# Patient Record
Sex: Female | Born: 1990 | Race: Black or African American | Hispanic: No | Marital: Single | Smoking: Current every day smoker
Health system: Southern US, Community
[De-identification: ages and names within clinical notes are randomized; demographics above are authoritative.]

## PROBLEM LIST (undated history)

## (undated) DIAGNOSIS — N912 Amenorrhea, unspecified: Secondary | ICD-10-CM

## (undated) DIAGNOSIS — I10 Essential (primary) hypertension: Secondary | ICD-10-CM

## (undated) DIAGNOSIS — E669 Obesity, unspecified: Secondary | ICD-10-CM

## (undated) DIAGNOSIS — D649 Anemia, unspecified: Secondary | ICD-10-CM

## (undated) HISTORY — DX: Essential (primary) hypertension: I10

## (undated) HISTORY — DX: Obesity, unspecified: E66.9

---
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
## 2011-04-21 ENCOUNTER — Emergency Department: Payer: Self-pay | Admitting: Emergency Medicine

## 2011-08-18 ENCOUNTER — Emergency Department: Payer: Self-pay | Admitting: Emergency Medicine

## 2013-01-27 ENCOUNTER — Emergency Department: Payer: Self-pay | Admitting: Emergency Medicine

## 2014-07-04 ENCOUNTER — Emergency Department: Payer: Self-pay | Admitting: Emergency Medicine

## 2015-10-28 ENCOUNTER — Encounter: Payer: Self-pay | Admitting: Emergency Medicine

## 2015-10-28 ENCOUNTER — Emergency Department
Admission: EM | Admit: 2015-10-28 | Discharge: 2015-10-29 | Disposition: A | Discharge status: Home / Self Care | Payer: Self-pay | Attending: Emergency Medicine | Admitting: Emergency Medicine

## 2015-10-28 ENCOUNTER — Emergency Department: Payer: Self-pay

## 2015-10-28 DIAGNOSIS — R112 Nausea with vomiting, unspecified: Secondary | ICD-10-CM

## 2015-10-28 DIAGNOSIS — R1032 Left lower quadrant pain: Secondary | ICD-10-CM

## 2015-10-28 DIAGNOSIS — K529 Noninfective gastroenteritis and colitis, unspecified: Secondary | ICD-10-CM | POA: Insufficient documentation

## 2015-10-28 DIAGNOSIS — R197 Diarrhea, unspecified: Secondary | ICD-10-CM

## 2015-10-28 DIAGNOSIS — R1031 Right lower quadrant pain: Secondary | ICD-10-CM

## 2015-10-28 DIAGNOSIS — Z3202 Encounter for pregnancy test, result negative: Secondary | ICD-10-CM | POA: Insufficient documentation

## 2015-10-28 DIAGNOSIS — F17210 Nicotine dependence, cigarettes, uncomplicated: Secondary | ICD-10-CM | POA: Insufficient documentation

## 2015-10-28 DIAGNOSIS — R300 Dysuria: Secondary | ICD-10-CM | POA: Insufficient documentation

## 2015-10-28 DIAGNOSIS — Z79899 Other long term (current) drug therapy: Secondary | ICD-10-CM | POA: Insufficient documentation

## 2015-10-28 HISTORY — DX: Anemia, unspecified: D64.9

## 2015-10-28 HISTORY — DX: Amenorrhea, unspecified: N91.2

## 2015-10-28 LAB — CBC WITH DIFFERENTIAL/PLATELET
BASOS ABS: 0 10*3/uL (ref 0–0.1)
Basophils Relative: 0 %
Eosinophils Absolute: 0.1 10*3/uL (ref 0–0.7)
Eosinophils Relative: 0 %
HEMATOCRIT: 34.1 % — AB (ref 35.0–47.0)
HEMOGLOBIN: 10.4 g/dL — AB (ref 12.0–16.0)
LYMPHS PCT: 12 %
Lymphs Abs: 1.8 10*3/uL (ref 1.0–3.6)
MCH: 21 pg — ABNORMAL LOW (ref 26.0–34.0)
MCHC: 30.5 g/dL — ABNORMAL LOW (ref 32.0–36.0)
MCV: 69 fL — AB (ref 80.0–100.0)
MONO ABS: 0.6 10*3/uL (ref 0.2–0.9)
MONOS PCT: 4 %
Neutro Abs: 12.3 10*3/uL — ABNORMAL HIGH (ref 1.4–6.5)
Neutrophils Relative %: 84 %
Platelets: 296 10*3/uL (ref 150–440)
RBC: 4.95 MIL/uL (ref 3.80–5.20)
RDW: 17.9 % — AB (ref 11.5–14.5)
WBC: 14.9 10*3/uL — ABNORMAL HIGH (ref 3.6–11.0)

## 2015-10-28 LAB — BASIC METABOLIC PANEL
ANION GAP: 8 (ref 5–15)
BUN: 12 mg/dL (ref 6–20)
CALCIUM: 8.9 mg/dL (ref 8.9–10.3)
CHLORIDE: 109 mmol/L (ref 101–111)
CO2: 24 mmol/L (ref 22–32)
Creatinine, Ser: 0.75 mg/dL (ref 0.44–1.00)
GFR calc Af Amer: >60 mL/min (ref >60)
GFR calc non Af Amer: >60 mL/min (ref >60)
GLUCOSE: 96 mg/dL (ref 65–99)
Potassium: 3.7 mmol/L (ref 3.5–5.1)
Sodium: 141 mmol/L (ref 135–145)

## 2015-10-28 LAB — URINALYSIS COMPLETE WITH MICROSCOPIC (ARMC ONLY)
BACTERIA UA: NONE SEEN
Bilirubin Urine: NEGATIVE
Glucose, UA: NEGATIVE mg/dL
LEUKOCYTES UA: NEGATIVE
Nitrite: NEGATIVE
PH: 6 (ref 5.0–8.0)
Protein, ur: 30 mg/dL — AB
SPECIFIC GRAVITY, URINE: 1.015 (ref 1.005–1.030)

## 2015-10-28 MED ORDER — SODIUM CHLORIDE 0.9 % IV BOLUS (SEPSIS)
500.0000 mL | Freq: Once | INTRAVENOUS | Status: AC
  Administered 2015-10-28: 500 mL via INTRAVENOUS

## 2015-10-28 MED ORDER — KETOROLAC TROMETHAMINE 30 MG/ML IJ SOLN
30.0000 mg | Freq: Once | INTRAMUSCULAR | Status: AC
  Administered 2015-10-28: 30 mg via INTRAVENOUS
  Filled 2015-10-28: qty 1

## 2015-10-28 MED ORDER — ONDANSETRON HCL 4 MG/2ML IJ SOLN
4.0000 mg | Freq: Once | INTRAMUSCULAR | Status: AC
  Administered 2015-10-28: 4 mg via INTRAVENOUS
  Filled 2015-10-28: qty 2

## 2015-10-28 NOTE — ED Notes (Addendum)
POC preg negative.

## 2015-10-28 NOTE — ED Provider Notes (Signed)
Time Seen: Approximately ----------------------------------------- 9:52 PM on 10/28/2015 -----------------------------------------   I have reviewed the triage notes  Chief Complaint: Dysuria; Emesis; Diarrhea; and Abdominal Pain   History of Present Illness: Tamara Aguirre is a 24 y.o. female who presents with lower middle quadrant abdominal pain over the last 24 hours. She states on Saturday she started with burning with urination. She has chronic vaginal bleeding is currently on megestrol. She's not sure if there was blood in her urine or her stool. She states she had some nausea and vomiting 2 earlier today with no blood or bile. She states she's had approximately 10 loose watery stools today without any obvious melena on again has history of vaginal bleeding so she cannot tell whether or not there is blood in her stool. Aware of any fever at home but arrives at 40F here. States she's had similar symptoms before which turned out to be a urinary tract infection. She denies any focal nature to her pain and points mainly to the lower midline area. She is not aware of any obvious exacerbating or relieving factors.   Past Medical History  Diagnosis Date  . Anemia   . Amenorrhea     There are no active problems to display for this patient.   History reviewed. No pertinent past surgical history.  History reviewed. No pertinent past surgical history.  Current Outpatient Rx  Name  Route  Sig  Dispense  Refill  . megestrol (MEGACE) 20 MG tablet   Oral   Take 20 mg by mouth 2 (two) times daily.           Allergies:  Sulfa antibiotics  Family History: History reviewed. No pertinent family history.  Social History: Social History  Substance Use Topics  . Smoking status: Current Every Day Smoker -- 0.50 packs/day    Types: Cigarettes  . Smokeless tobacco: None  . Alcohol Use: Yes     Comment: seldom     Review of Systems:   10 point review of systems was performed  and was otherwise negative:  Constitutional: No fever Eyes: No visual disturbances ENT: No sore throat, ear pain Cardiac: No chest pain Respiratory: No shortness of breath, wheezing, or stridor Abdomen: Lower middle quadrant abdominal pain relatively constant without radiation., Patient's still nauseated but the vomiting has slowed. Loose frequent watery stool. Endocrine: No weight loss, No night sweats Extremities: No peripheral edema, cyanosis Skin: No rashes, easy bruising Neurologic: No focal weakness, trouble with speech or swollowing Urologic: Patient does still have burning with urination, no obvious Hematuria, or urinary frequency   Physical Exam:  ED Triage Vitals  Enc Vitals Group     BP 10/28/15 2124 170/112 mmHg     Pulse Rate 10/28/15 2124 90     Resp 10/28/15 2124 18     Temp 10/28/15 2124 99 F (37.2 C)     Temp Source 10/28/15 2124 Oral     SpO2 10/28/15 2124 99 %     Weight 10/28/15 2124 238 lb (107.956 kg)     Height 10/28/15 2124  (1.676 m)     Head Cir --      Peak Flow --      Pain Score 10/28/15 2125 10     Pain Loc --      Pain Edu? --      Excl. in GC? --     General: Awake , Alert , and Oriented times 3; GCS 15 Head: Normal cephalic ,  atraumatic Eyes: Pupils equal , round, reactive to light Nose/Throat: No nasal drainage, patent upper airway without erythema or exudate.  Neck: Supple, Full range of motion, No anterior adenopathy or palpable thyroid masses Lungs: Clear to ascultation without wheezes , rhonchi, or rales Heart: Regular rate, regular rhythm without murmurs , gallops , or rubs Abdomen: Soft, non tender without rebound, guarding , or rigidity; bowel sounds positive and symmetric in all 4 quadrants. No organomegaly .        Extremities: 2 plus symmetric pulses. No edema, clubbing or cyanosis Neurologic: normal ambulation, Motor symmetric without deficits, sensory intact Skin: warm, dry, no rashes   Labs:   All laboratory work  was reviewed including any pertinent negatives or positives listed below:  Labs Reviewed  URINALYSIS COMPLETEWITH MICROSCOPIC ([facility] ONLY)  CBC WITH DIFFERENTIAL/PLATELET  BASIC METABOLIC PANEL   Review of laboratory work showed a slightly elevated white blood cell count   Radiology:    urinary frequency since Friday. Nausea, vomiting, and diarrhea  EXAM: CT ABDOMEN AND PELVIS WITHOUT CONTRAST  TECHNIQUE: Multidetector CT imaging of the abdomen and pelvis was performed following the standard protocol without IV contrast.  COMPARISON: None.  FINDINGS: The lung bases are clear.  The kidneys are symmetrical in size and shape. No hydronephrosis or hydroureter. No renal, ureteral, or bladder stones. No bladder wall thickening.  The unenhanced appearance of the liver, spleen, gallbladder, pancreas, adrenal glands, abdominal aorta, inferior vena cava, and retroperitoneal lymph nodes is unremarkable. Stomach, small bowel, and colon are mostly decompressed. No free air or free fluid in the abdomen. Abdominal wall musculature appears intact.  Pelvis: Appendix is normal. Uterus and ovaries are not enlarged. No free or loculated pelvic fluid collections. No pelvic mass or lymphadenopathy. No destructive bone lesions.  IMPRESSION: No renal or ureteral stone or obstruction.   I personally reviewed the radiologic studies    ED Course:  Differential diagnosis includes but is not exclusive to ovarian cyst, ovarian torsion, acute appendicitis, urinary tract infection, endometriosis, bowel obstruction, colitis, renal colic, gastroenteritis, etc. Patient's findings pointed to this being some form of enteritis. She does not appear to have a kidney stone or surgical cause for her abdominal pain at this time. Patient remained hemodynamically stable and symptomatically improved with IV fluids and medications. She was advised to return here if she develops a high fever, bloody diarrhea,  uncontrolled vomiting or any other new concerns.    Assessment:  Acute gastroenteritis     Plan: * Outpatient management Patient was advised to return immediately if condition worsens. Patient was advised to follow up with their primary care physician or other specialized physicians involved in their outpatient care             Jennye MoccasinBrian S Quigley, MD 11/01/15 1159

## 2015-10-28 NOTE — ED Notes (Signed)
Pt c/o low midline abdominal pain with dysuria and urinary frequency since Friday; also having nausea, minimal vomiting, and diarrhea; afebrile in triage

## 2015-10-29 MED ORDER — ONDANSETRON 4 MG PO TBDP: 4.0000 mg | ORAL_TABLET | Freq: Three times a day (TID) | ORAL | Status: DC | PRN

## 2015-10-29 NOTE — ED Provider Notes (Signed)
-----------------------------------------   1:16 AM on 10/29/2015 -----------------------------------------  CT renal stone study interpreted per Dr. Andria MeuseStevens: No renal or ureteral stone or obstruction.  Updated patient of imaging results. Patient feeling better and eager for discharge. Symptoms likely secondary to enteritis. Prescription for Zofran, dietary restrictions and follow-up with PCP. Strict return precautions given. Patient verbalizes understanding and agrees with plan of care.  Irean HongJade J Sung, MD 10/29/15 [phone]

## 2015-10-29 NOTE — Discharge Instructions (Signed)
1. Take nausea medicine as needed (Zofran #20). 2. Clear liquids 12 hours, then BRAT diet 3 days, then slowly advance diet as tolerated. 3. Return to the ER for worsening symptoms, persistent vomiting, difficult breathing or other concerns.  Abdominal Pain, Adult Many things can cause abdominal pain. Usually, abdominal pain is not caused by a disease and will improve without treatment. It can often be observed and treated at home. Your health care provider will do a physical exam and possibly order blood tests and X-rays to help determine the seriousness of your pain. However, in many cases, more time must pass before a clear cause of the pain can be found. Before that point, your health care provider may not know if you need more testing or further treatment. HOME CARE INSTRUCTIONS Monitor your abdominal pain for any changes. The following actions may help to alleviate any discomfort you are experiencing:  Only take over-the-counter or prescription medicines as directed by your health care provider.  Do not take laxatives unless directed to do so by your health care provider.  Try a clear liquid diet (broth, tea, or water) as directed by your health care provider. Slowly move to a bland diet as tolerated. SEEK MEDICAL CARE IF:  You have unexplained abdominal pain.  You have abdominal pain associated with nausea or diarrhea.  You have pain when you urinate or have a bowel movement.  You experience abdominal pain that wakes you in the night.  You have abdominal pain that is worsened or improved by eating food.  You have abdominal pain that is worsened with eating fatty foods.  You have a fever. SEEK IMMEDIATE MEDICAL CARE IF:  Your pain does not go away within 2 hours.  You keep throwing up (vomiting).  Your pain is felt only in portions of the abdomen, such as the right side or the left lower portion of the abdomen.  You pass bloody or black tarry stools. MAKE SURE  YOU:  Understand these instructions.  Will watch your condition.  Will get help right away if you are not doing well or get worse.   This information is not intended to replace advice given to you by your health care provider. Make sure you discuss any questions you have with your health care provider.   Document Released: 08/13/2005 Document Revised: 07/25/2015 Document Reviewed: 07/13/2013 Elsevier Interactive Patient Education 2016 Elsevier Inc.  Diarrhea Diarrhea is frequent loose and watery bowel movements. It can cause you to feel weak and dehydrated. Dehydration can cause you to become tired and thirsty, have a dry mouth, and have decreased urination that often is dark yellow. Diarrhea is a sign of another problem, most often an infection that will not last long. In most cases, diarrhea typically lasts 2-3 days. However, it can last longer if it is a sign of something more serious. It is important to treat your diarrhea as directed by your caregiver to lessen or prevent future episodes of diarrhea. CAUSES  Some common causes include:  Gastrointestinal infections caused by viruses, bacteria, or parasites.  Food poisoning or food allergies.  Certain medicines, such as antibiotics, chemotherapy, and laxatives.  Artificial sweeteners and fructose.  Digestive disorders. HOME CARE INSTRUCTIONS  Ensure adequate fluid intake (hydration): Have 1 cup (8 oz) of fluid for each diarrhea episode. Avoid fluids that contain simple sugars or sports drinks, fruit juices, whole milk products, and sodas. Your urine should be clear or pale yellow if you are drinking enough fluids. Hydrate with  an oral rehydration solution that you can purchase at pharmacies, retail stores, and online. You can prepare an oral rehydration solution at home by mixing the following ingredients together:   - tsp table salt.   tsp baking soda.   tsp salt substitute containing potassium chloride.  1  tablespoons  sugar.  1 L (34 oz) of water.  Certain foods and beverages may increase the speed at which food moves through the gastrointestinal (GI) tract. These foods and beverages should be avoided and include:  Caffeinated and alcoholic beverages.  High-fiber foods, such as raw fruits and vegetables, nuts, seeds, and whole grain breads and cereals.  Foods and beverages sweetened with sugar alcohols, such as xylitol, sorbitol, and mannitol.  Some foods may be well tolerated and may help thicken stool including:  Starchy foods, such as rice, toast, pasta, low-sugar cereal, oatmeal, grits, baked potatoes, crackers, and bagels.  Bananas.  Applesauce.  Add probiotic-rich foods to help increase healthy bacteria in the GI tract, such as yogurt and fermented milk products.  Wash your hands well after each diarrhea episode.  Only take over-the-counter or prescription medicines as directed by your caregiver.  Take a warm bath to relieve any burning or pain from frequent diarrhea episodes. SEEK IMMEDIATE MEDICAL CARE IF:   You are unable to keep fluids down.  You have persistent vomiting.  You have blood in your stool, or your stools are black and tarry.  You do not urinate in 6-8 hours, or there is only a small amount of very dark urine.  You have abdominal pain that increases or localizes.  You have weakness, dizziness, confusion, or light-headedness.  You have a severe headache.  Your diarrhea gets worse or does not get better.  You have a fever or persistent symptoms for more than 2-3 days.  You have a fever and your symptoms suddenly get worse. MAKE SURE YOU:   Understand these instructions.  Will watch your condition.  Will get help right away if you are not doing well or get worse.   This information is not intended to replace advice given to you by your health care provider. Make sure you discuss any questions you have with your health care provider.   Document Released:  10/24/2002 Document Revised: 11/24/2014 Document Reviewed: 07/11/2012 Elsevier Interactive Patient Education 2016 [employer].  Food Choices to Help Relieve Diarrhea, Adult When you have diarrhea, the foods you eat and your eating habits are very important. Choosing the right foods and drinks can help relieve diarrhea. Also, because diarrhea can last up to 7 days, you need to replace lost fluids and electrolytes (such as sodium, potassium, and chloride) in order to help prevent dehydration.  WHAT GENERAL GUIDELINES DO I NEED TO FOLLOW?  Slowly drink 1 cup (8 oz) of fluid for each episode of diarrhea. If you are getting enough fluid, your urine will be clear or pale yellow.  Eat starchy foods. Some good choices include white rice, white toast, pasta, low-fiber cereal, baked potatoes (without the skin), saltine crackers, and bagels.  Avoid large servings of any cooked vegetables.  Limit fruit to two servings per day. A serving is  cup or 1 small piece.  Choose foods with less than 2 g of fiber per serving.  Limit fats to less than 8 tsp (38 g) per day.  Avoid fried foods.  Eat foods that have probiotics in them. Probiotics can be found in certain dairy products.  Avoid foods and beverages that may  increase the speed at which food moves through the stomach and intestines (gastrointestinal tract). Things to avoid include:  High-fiber foods, such as dried fruit, raw fruits and vegetables, nuts, seeds, and whole grain foods.  Spicy foods and high-fat foods.  Foods and beverages sweetened with high-fructose corn syrup, honey, or sugar alcohols such as xylitol, sorbitol, and mannitol. WHAT FOODS ARE RECOMMENDED? Grains White rice. White, [place], or pita breads (fresh or toasted), including plain rolls, buns, or bagels. White pasta. Saltine, soda, or graham crackers. Pretzels. Low-fiber cereal. Cooked cereals made with water (such as cornmeal, farina, or cream cereals). Plain muffins.  Matzo. Melba toast. Zwieback.  Vegetables Potatoes (without the skin). Strained tomato and vegetable juices. Most well-cooked and canned vegetables without seeds. Tender lettuce. Fruits Cooked or canned applesauce, apricots, cherries, fruit cocktail, grapefruit, peaches, pears, or plums. Fresh bananas, apples without skin, cherries, grapes, cantaloupe, grapefruit, peaches, oranges, or plums.  Meat and Other Protein Products Baked or boiled chicken. Eggs. Tofu. Fish. Seafood. Smooth peanut butter. Ground or well-cooked tender beef, ham, veal, lamb, pork, or poultry.  Dairy Plain yogurt, kefir, and unsweetened liquid yogurt. Lactose-free milk, buttermilk, or soy milk. Plain hard cheese. Beverages Sport drinks. Clear broths. Diluted fruit juices (except prune). Regular, caffeine-free sodas such as ginger ale. Water. Decaffeinated teas. Oral rehydration solutions. Sugar-free beverages not sweetened with sugar alcohols. Other Bouillon, broth, or soups made from recommended foods.  The items listed above may not be a complete list of recommended foods or beverages. Contact your dietitian for more options. WHAT FOODS ARE NOT RECOMMENDED? Grains Whole grain, whole wheat, bran, or rye breads, rolls, pastas, crackers, and cereals. Wild or brown rice. Cereals that contain more than 2 g of fiber per serving. Corn tortillas or taco shells. Cooked or dry oatmeal. Granola. Popcorn. Vegetables Raw vegetables. Cabbage, broccoli, Brussels sprouts, artichokes, baked beans, beet greens, corn, kale, legumes, peas, sweet potatoes, and yams. Potato skins. Cooked spinach and cabbage. Fruits Dried fruit, including raisins and dates. Raw fruits. Stewed or dried prunes. Fresh apples with skin, apricots, mangoes, pears, raspberries, and strawberries.  Meat and Other Protein Products Chunky peanut butter. Nuts and seeds. Beans and lentils. Tomasa Blase.  Dairy High-fat cheeses. Milk, chocolate milk, and beverages made with  milk, such as milk shakes. Cream. Ice cream. Sweets and Desserts Sweet rolls, doughnuts, and sweet breads. Pancakes and waffles. Fats and Oils Butter. Cream sauces. Margarine. Salad oils. Plain salad dressings. Olives. Avocados.  Beverages Caffeinated beverages (such as coffee, tea, soda, or energy drinks). Alcoholic beverages. Fruit juices with pulp. Prune juice. Soft drinks sweetened with high-fructose corn syrup or sugar alcohols. Other Coconut. Hot sauce. Chili powder. Mayonnaise. Gravy. Cream-based or milk-based soups.  The items listed above may not be a complete list of foods and beverages to avoid. Contact your dietitian for more information. WHAT SHOULD I DO IF I BECOME DEHYDRATED? Diarrhea can sometimes lead to dehydration. Signs of dehydration include dark urine and dry mouth and skin. If you think you are dehydrated, you should rehydrate with an oral rehydration solution. These solutions can be purchased at pharmacies, retail stores, or online.  Drink -1 cup (120-240 mL) of oral rehydration solution each time you have an episode of diarrhea. If drinking this amount makes your diarrhea worse, try drinking smaller amounts more often. For example, drink 1-3 tsp (5-15 mL) every 5-10 minutes.  A general rule for staying hydrated is to drink 1-2 L of fluid per day. Talk to your health care provider about  the specific amount you should be drinking each day. Drink enough fluids to keep your urine clear or pale yellow.   This information is not intended to replace advice given to you by your health care provider. Make sure you discuss any questions you have with your health care provider.   Document Released: 01/24/2004 Document Revised: 11/24/2014 Document Reviewed: 09/26/2013 Elsevier Interactive Patient Education 2016 Elsevier Inc.  Nausea and Vomiting Nausea is a sick feeling that often comes before throwing up (vomiting). Vomiting is a reflex where stomach contents come out of your  mouth. Vomiting can cause severe loss of body fluids (dehydration). Children and elderly adults can become dehydrated quickly, especially if they also have diarrhea. Nausea and vomiting are symptoms of a condition or disease. It is important to find the cause of your symptoms. CAUSES   Direct irritation of the stomach lining. This irritation can result from increased acid production (gastroesophageal reflux disease), infection, food poisoning, taking certain medicines (such as nonsteroidal anti-inflammatory drugs), alcohol use, or tobacco use.  Signals from the brain.These signals could be caused by a headache, heat exposure, an inner ear disturbance, increased pressure in the brain from injury, infection, a tumor, or a concussion, pain, emotional stimulus, or metabolic problems.  An obstruction in the gastrointestinal tract (bowel obstruction).  Illnesses such as diabetes, hepatitis, gallbladder problems, appendicitis, kidney problems, cancer, sepsis, atypical symptoms of a heart attack, or eating disorders.  Medical treatments such as chemotherapy and radiation.  Receiving medicine that makes you sleep (general anesthetic) during surgery. DIAGNOSIS Your caregiver may ask for tests to be done if the problems do not improve after a few days. Tests may also be done if symptoms are severe or if the reason for the nausea and vomiting is not clear. Tests may include:  Urine tests.  Blood tests.  Stool tests.  Cultures (to look for evidence of infection).  X-rays or other imaging studies. Test results can help your caregiver make decisions about treatment or the need for additional tests. TREATMENT You need to stay well hydrated. Drink frequently but in small amounts.You may wish to drink water, sports drinks, clear broth, or eat frozen ice pops or gelatin dessert to help stay hydrated.When you eat, eating slowly may help prevent nausea.There are also some antinausea medicines that may  help prevent nausea. HOME CARE INSTRUCTIONS   Take all medicine as directed by your caregiver.  If you do not have an appetite, do not force yourself to eat. However, you must continue to drink fluids.  If you have an appetite, eat a normal diet unless your caregiver tells you differently.  Eat a variety of complex carbohydrates (rice, wheat, potatoes, bread), lean meats, yogurt, fruits, and vegetables.  Avoid high-fat foods because they are more difficult to digest.  Drink enough water and fluids to keep your urine clear or pale yellow.  If you are dehydrated, ask your caregiver for specific rehydration instructions. Signs of dehydration may include:  Severe thirst.  Dry lips and mouth.  Dizziness.  Dark urine.  Decreasing urine frequency and amount.  Confusion.  Rapid breathing or pulse. SEEK IMMEDIATE MEDICAL CARE IF:   You have blood or brown flecks (like coffee grounds) in your vomit.  You have black or bloody stools.  You have a severe headache or stiff neck.  You are confused.  You have severe abdominal pain.  You have chest pain or trouble breathing.  You do not urinate at least once every 8 hours.  You develop cold or clammy skin.  You continue to vomit for longer than 24 to 48 hours.  You have a fever. MAKE SURE YOU:   Understand these instructions.  Will watch your condition.  Will get help right away if you are not doing well or get worse.   This information is not intended to replace advice given to you by your health care provider. Make sure you discuss any questions you have with your health care provider.   Document Released: 11/03/2005 Document Revised: 01/26/2012 Document Reviewed: 04/02/2011 Elsevier Interactive Patient Education [employer] Elsevier Inc.

## 2016-03-02 IMAGING — CT CT RENAL STONE PROTOCOL
1 of 2 series · 15 of 32 positions shown, 19 images · non-contrast
Comparison: None.

CLINICAL DATA: Low midline abdominal pain bilaterally. Dysuria and
urinary frequency since [REDACTED]. Nausea, vomiting, and diarrhea

EXAM:
CT ABDOMEN AND PELVIS WITHOUT CONTRAST
TECHNIQUE: Multidetector CT imaging of the abdomen and pelvis was performed
following the standard protocol without IV contrast.

[Series 2: stone standard full · axial · 0.74mm/px · z∈[-463,-13]mm · 15 of 98 slices shown, 19 images]
[im 4/98  soft-tissue]
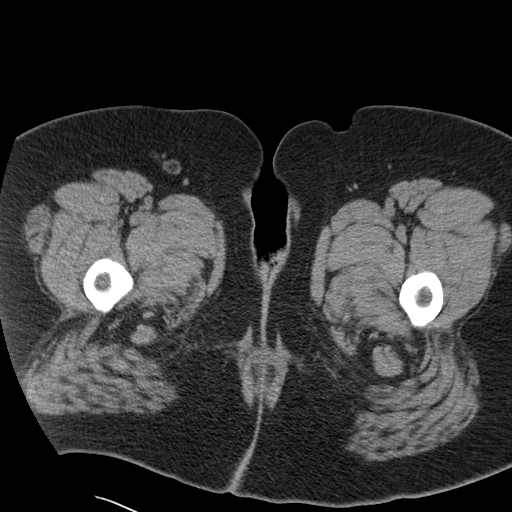
[im 4/98  bone]
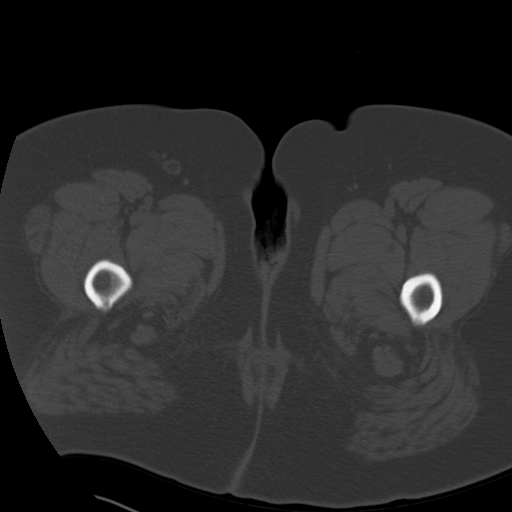
[im 12/98  soft-tissue]
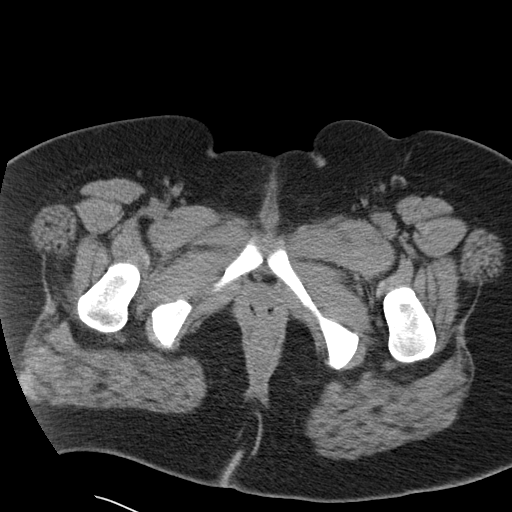
[im 20/98  soft-tissue]
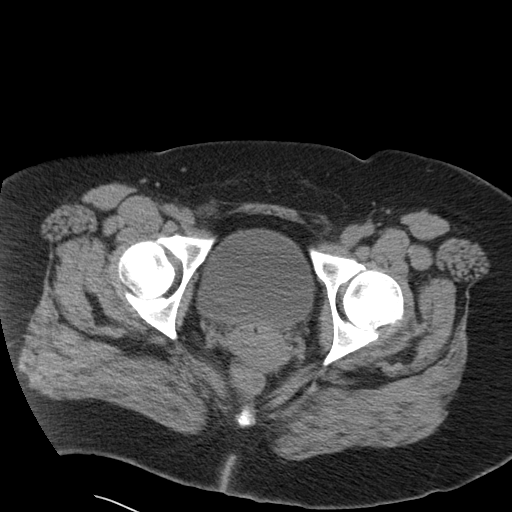
[im 28/98  soft-tissue]
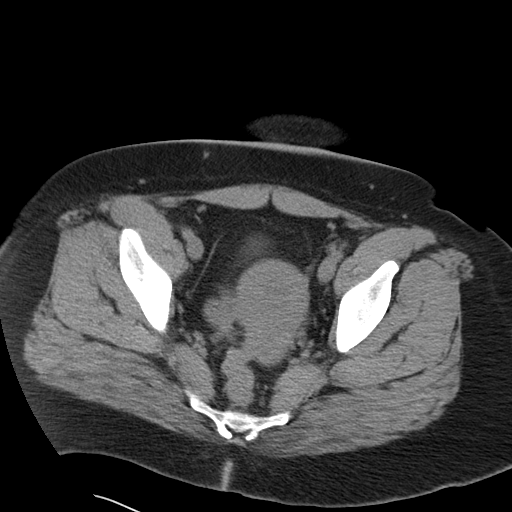
[im 35/98  soft-tissue]
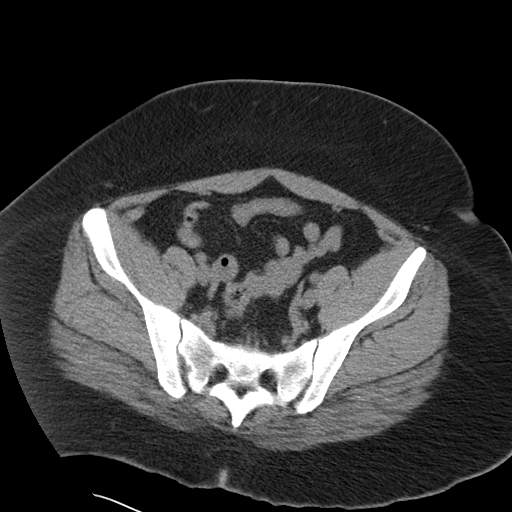
[im 43/98  soft-tissue]
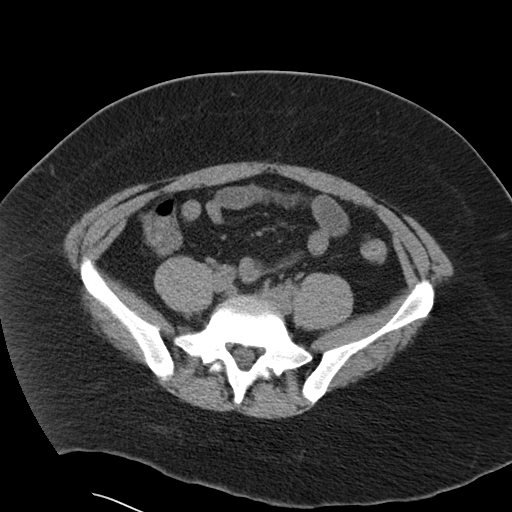
[im 51/98  soft-tissue]
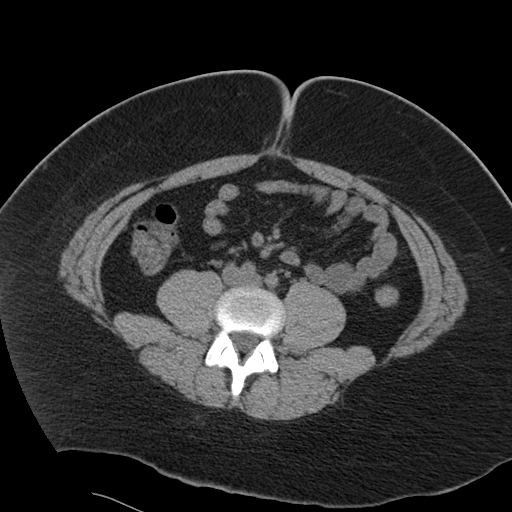
[im 55/98  soft-tissue]
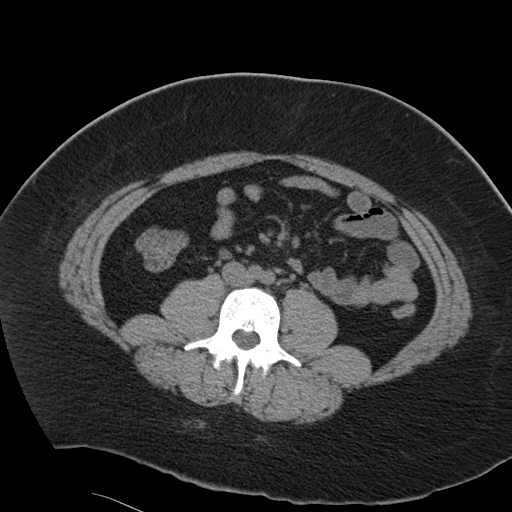
[im 63/98  soft-tissue]
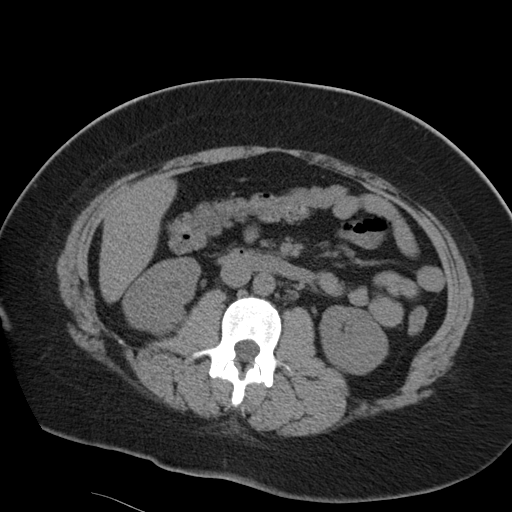
[im 63/98  bone]
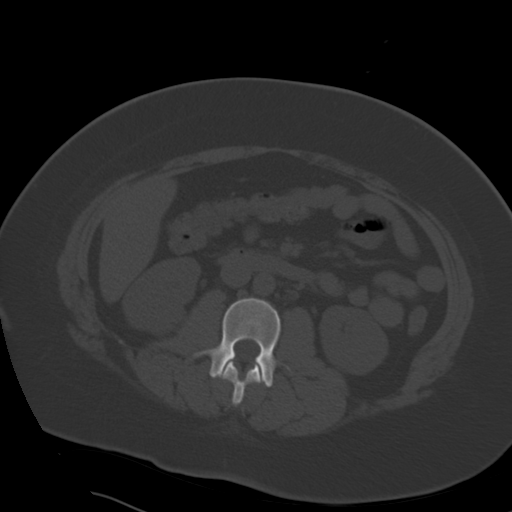
[im 70/98  soft-tissue]
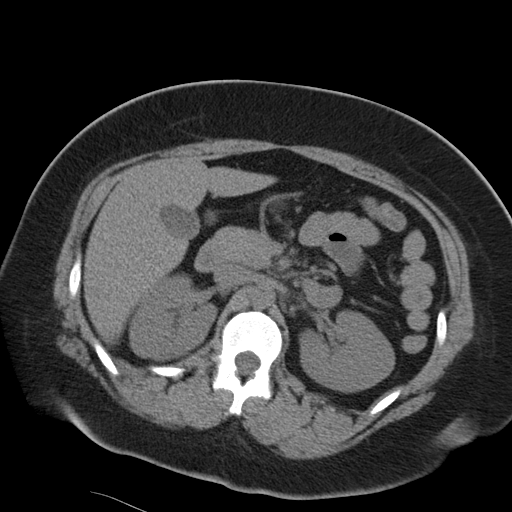
[im 78/98  soft-tissue]
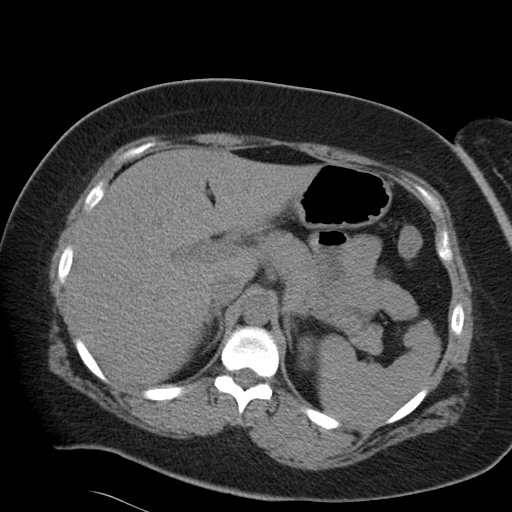
[im 82/98  lung]
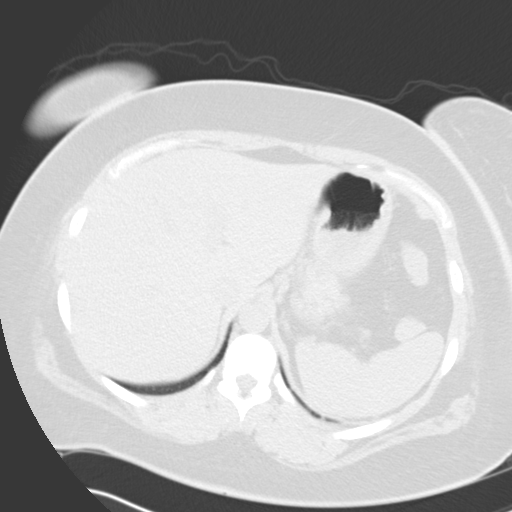
[im 86/98  soft-tissue]
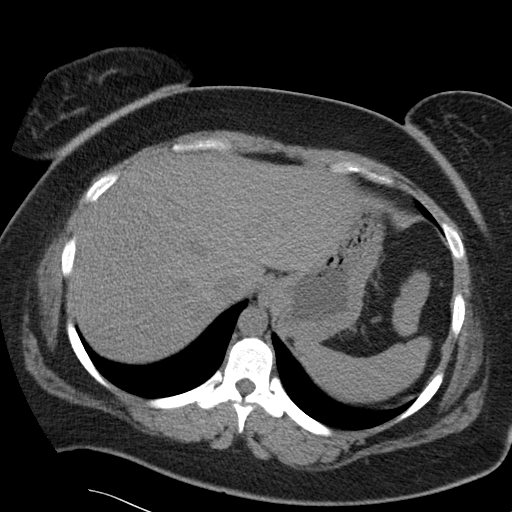
[im 86/98  lung]
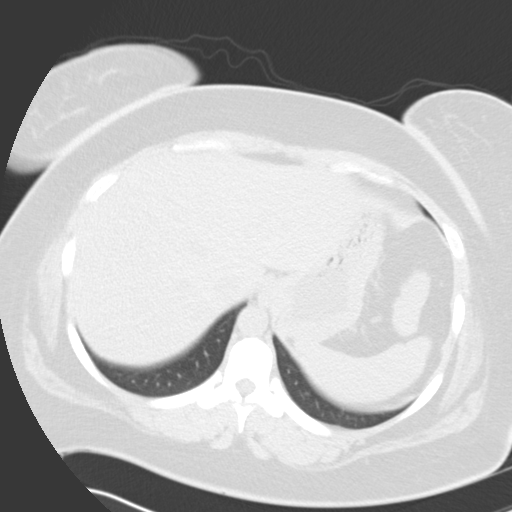
[im 90/98  lung]
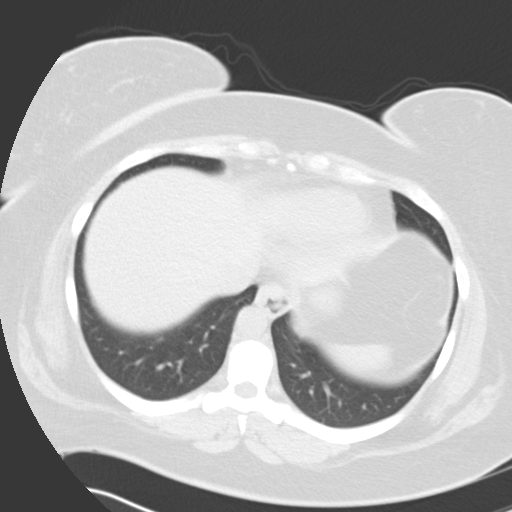
[im 94/98  soft-tissue]
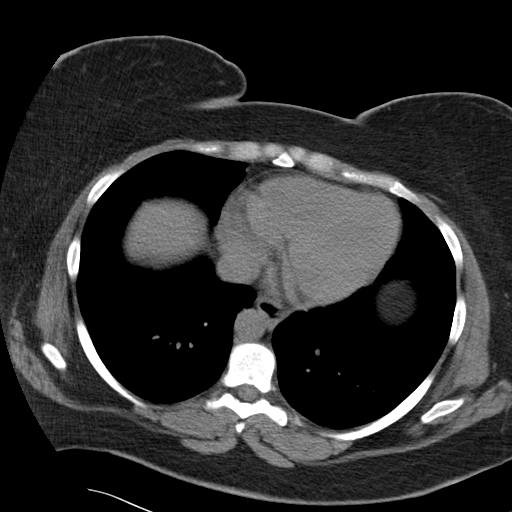
[im 94/98  lung]
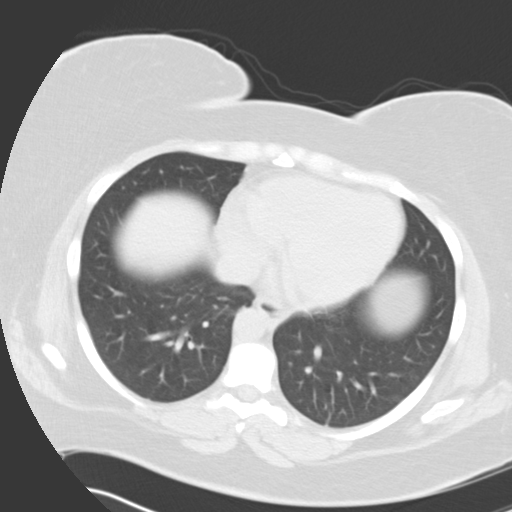

[15 of 32 positions shown; findings below may reference images not displayed]

FINDINGS: The lung bases are clear.

The kidneys are symmetrical in size and shape. No hydronephrosis or
hydroureter. No renal, ureteral, or bladder stones. No bladder wall
thickening.

The unenhanced appearance of the liver, spleen, gallbladder,
pancreas, adrenal glands, abdominal aorta, inferior vena cava, and
retroperitoneal lymph nodes is unremarkable. Stomach, small bowel,
and colon are mostly decompressed. No free air or free fluid in the
abdomen. Abdominal wall musculature appears intact.

Pelvis: Appendix is normal. Uterus and ovaries are not enlarged. No
free or loculated pelvic fluid collections. No pelvic mass or
lymphadenopathy. No destructive bone lesions.
IMPRESSION: No renal or ureteral stone or obstruction.

## 2016-06-30 ENCOUNTER — Encounter: Payer: Self-pay | Admitting: *Deleted

## 2016-07-08 ENCOUNTER — Encounter: Payer: Self-pay | Admitting: Obstetrics & Gynecology

## 2022-05-13 ENCOUNTER — Emergency Department (HOSPITAL_COMMUNITY)
Admission: EM | Admit: 2022-05-13 | Discharge: 2022-05-13 | Disposition: A | Discharge status: Home / Self Care | Payer: Self-pay | Attending: Emergency Medicine | Admitting: Emergency Medicine

## 2022-05-13 ENCOUNTER — Encounter (HOSPITAL_COMMUNITY): Payer: Self-pay | Admitting: Emergency Medicine

## 2022-05-13 ENCOUNTER — Other Ambulatory Visit: Payer: Self-pay

## 2022-05-13 DIAGNOSIS — I10 Essential (primary) hypertension: Secondary | ICD-10-CM | POA: Insufficient documentation

## 2022-05-13 DIAGNOSIS — Z79899 Other long term (current) drug therapy: Secondary | ICD-10-CM | POA: Insufficient documentation

## 2022-05-13 DIAGNOSIS — K047 Periapical abscess without sinus: Secondary | ICD-10-CM

## 2022-05-13 DIAGNOSIS — K029 Dental caries, unspecified: Secondary | ICD-10-CM

## 2022-05-13 MED ORDER — PENICILLIN V POTASSIUM 250 MG PO TABS
500.0000 mg | ORAL_TABLET | Freq: Once | ORAL | Status: AC
  Administered 2022-05-13: 500 mg via ORAL
  Filled 2022-05-13: qty 2

## 2022-05-13 MED ORDER — PENICILLIN V POTASSIUM 500 MG PO TABS: 500.0000 mg | ORAL_TABLET | Freq: Four times a day (QID) | ORAL | 0 refills | Status: AC

## 2022-05-13 NOTE — ED Provider Notes (Signed)
[facility] EMERGENCY DEPARTMENT Provider Note   CSN: [id number] Arrival date & time: 05/13/22  1543     History  Chief Complaint  Patient presents with   Dental Pain    Tamara Aguirre is a 31 y.o. female with chief complaint of worsening dental pain over the last 3-4 days.  These teeth have been intermittently painful over the last 20 years.  Has 2 broken teeth on the bottom right side of his mouth.  Saw his dentist yesterday, was given metronidazole.  Patient states this does not help.  Denies fevers or chills, neck pain, tongue swelling, neck swelling, chest pain, palpitations, shortness of breath, or N/V.  Requesting a change in antibiotics, as he states amoxicillin worked better for him in the past.  He is supposed to follow-up with oral surgery to have the broken teeth extracted next week.  The history is provided by the patient and medical records.  Dental Pain      Home Medications Prior to Admission medications   Medication Sig Start Date End Date Taking? Authorizing Provider  penicillin v potassium (VEETID) 500 MG tablet Take 1 tablet (500 mg total) by mouth 4 (four) times daily for 10 days. 05/13/22 05/23/22 Yes Cecil Cobbs, PA-C  ferrous gluconate (FERGON) 324 MG tablet Take 324 mg by mouth 2 (two) times daily with a meal.    [provider]  megestrol (MEGACE) 20 MG tablet Take 20 mg by mouth 2 (two) times daily.    [provider]  olmesartan-hydrochlorothiazide (BENICAR HCT) 40-25 MG tablet Take 1 tablet by mouth daily.    [provider]      Allergies    Sulfa antibiotics    Review of Systems   Review of Systems  HENT:  Positive for dental problem.     Physical Exam Updated Vital Signs BP 130/80 (BP Location: Right Arm)   Pulse 76   Temp 98.7 F (37.1 C) (Oral)   Resp 16   Ht 5\' 6"  (1.676 m)   Wt (!) 141.5 kg   LMP  (LMP Unknown) Comment: depo  SpO2 96%   BMI 50.36 kg/m  Physical Exam Vitals and nursing note  reviewed.  Constitutional:      General: She is not in acute distress.    Appearance: She is well-developed.  HENT:     Head: Normocephalic and atraumatic.     Jaw: No swelling.      Comments: Mild tenderness and minimal swelling as indicated above    Right Ear: Hearing and external ear normal.     Left Ear: Hearing and external ear normal.     Nose: Nose normal.     Mouth/Throat:     Tongue: Tongue does not deviate from midline.     Pharynx: Uvula midline.      Comments: Poor overall dentition, with notable fractured teeth as indicated above.  No evidence of dental abscess, purulent discharge, bleeding, or lesions.  Tongue without evidence of swelling, elevation, or inhibited movement Eyes:     Conjunctiva/sclera: Conjunctivae normal.  Neck:     Comments: Without swelling or palpable mass.  Very supple on exam. Cardiovascular:     Rate and Rhythm: Normal rate and regular rhythm.     Pulses:          Radial pulses are 2+ on the right side and 2+ on the left side.     Heart sounds: Normal heart sounds. No murmur heard. Pulmonary:  Effort: Pulmonary effort is normal. No respiratory distress.     Breath sounds: Normal breath sounds.  Abdominal:     Palpations: Abdomen is soft.     Tenderness: There is no abdominal tenderness.  Musculoskeletal:        General: No swelling.     Cervical back: Neck supple.     Right lower leg: No edema.     Left lower leg: No edema.  Skin:    General: Skin is warm and dry.     Capillary Refill: Capillary refill takes less than 2 seconds.  Neurological:     Mental Status: She is alert.  Psychiatric:        Mood and Affect: Mood normal.     ED Results / Procedures / Treatments   Labs (all labs ordered are listed, but only abnormal results are displayed) Labs Reviewed - No data to display  EKG None  Radiology No results found.  Procedures Procedures    Medications Ordered in ED Medications  penicillin v potassium (VEETID)  tablet 500 mg (500 mg Oral Given 05/13/22 1852)    ED Course/ Medical Decision Making/ A&P                           Medical Decision Making Amount and/or Complexity of Data Reviewed External Data Reviewed: notes. Labs: ordered. Decision-making details documented in ED Course. Radiology: ordered and independent interpretation performed. Decision-making details documented in ED Course. ECG/medicine tests: ordered and independent interpretation performed. Decision-making details documented in ED Course.  Risk OTC drugs. Prescription drug management.   31 y.o. female presents to the ED for concern of Dental Pain   This involves an extensive number of treatment options, and is a complaint that carries with it a high risk of complications and morbidity.  The emergent differential diagnosis prior to evaluation includes, but is not limited to: Dental caries, dental abscess, Ludwig's angina  This is not an exhaustive differential.   Past Medical History / Co-morbidities / Social History: Hx of HTN, obesity, mild anemia  Additional History:  Internal and external records from outside source obtained and reviewed including ED visits  Physical Exam: Physical exam performed. The pertinent findings include: 2 broken teeth on the right lower jaw, numbers 31 and 32.  Lab Tests: None  Imaging Studies: None  ED Course: Pt well-appearing on exam.  Patient nontoxic nonseptic appearing in NAD.  Afebrile, nontachycardic.  Presenting with toothache.  No gross abscess.  ROM of mandible unimpaired.  Exam unconcerning for Ludwig's angina or spread of infection.  Given Flagyl yesterday.  Patient's usual toothaches treated with penicillin successfully, patient requests penicillin antibiotic.  Will treat with penicillin and anti-inflammatories medicine.  Urged patient to follow-up with dentist.  Patient satisfied with today's encounter.  Patient in NAD and in good condition at time of  discharge.  Disposition: After consideration of the diagnostic results and the patient's encounter today, I feel that the emergency department workup does not suggest an emergent condition requiring admission or immediate intervention beyond what has been performed at this time.  The patient is safe for discharge and has been instructed to return immediately for worsening symptoms, change in symptoms or any other concerns.  I have reviewed the patients home medicines and have made adjustments as needed.  Discussed course of treatment thoroughly with the patient, whom demonstrated understanding.  Patient in agreement and has no further questions.    I discussed this  case with my attending physician Dr. Hyacinth Meeker, who agreed with the proposed treatment course and cosigned this note including patient's presenting symptoms, physical exam, and planned diagnostics and interventions.  Attending physician stated agreement with plan or made changes to plan which were implemented.     This chart was dictated using voice recognition software.  Despite best efforts to proofread, errors can occur which can change the documentation meaning.         Final Clinical Impression(s) / ED Diagnoses Final diagnoses:  Pain due to dental caries  Dental caries  Dental infection    Rx / DC Orders ED Discharge Orders          Ordered    penicillin v potassium (VEETID) 500 MG tablet  4 times daily        05/13/22 1847              Sandrea Hammond 05/14/22 2117    Eber Hong, MD 05/15/22 1625

## 2022-06-12 ENCOUNTER — Emergency Department (HOSPITAL_COMMUNITY)
Admission: EM | Admit: 2022-06-12 | Discharge: 2022-06-12 | Disposition: A | Discharge status: Home / Self Care | Payer: Self-pay | Attending: Emergency Medicine | Admitting: Emergency Medicine

## 2022-06-12 ENCOUNTER — Encounter (HOSPITAL_COMMUNITY): Payer: Self-pay | Admitting: Emergency Medicine

## 2022-06-12 ENCOUNTER — Other Ambulatory Visit: Payer: Self-pay

## 2022-06-12 DIAGNOSIS — K0889 Other specified disorders of teeth and supporting structures: Secondary | ICD-10-CM | POA: Insufficient documentation

## 2022-06-12 MED ORDER — PENICILLIN V POTASSIUM 500 MG PO TABS: 500.0000 mg | ORAL_TABLET | Freq: Four times a day (QID) | ORAL | 0 refills | Status: AC

## 2022-06-12 MED ORDER — IBUPROFEN 800 MG PO TABS
800.0000 mg | ORAL_TABLET | Freq: Once | ORAL | Status: AC
  Administered 2022-06-12: 800 mg via ORAL
  Filled 2022-06-12: qty 1

## 2022-06-12 MED ORDER — PENICILLIN V POTASSIUM 250 MG PO TABS
500.0000 mg | ORAL_TABLET | Freq: Once | ORAL | Status: AC
  Administered 2022-06-12: 500 mg via ORAL
  Filled 2022-06-12: qty 2

## 2022-06-12 MED ORDER — LIDOCAINE VISCOUS HCL 2 % MT SOLN: 15.0000 mL | OROMUCOSAL | 1 refills | Status: AC | PRN

## 2022-06-12 MED ORDER — LIDOCAINE VISCOUS HCL 2 % MT SOLN
15.0000 mL | Freq: Once | OROMUCOSAL | Status: AC
  Administered 2022-06-12: 15 mL via OROMUCOSAL
  Filled 2022-06-12: qty 15

## 2022-06-12 MED ORDER — IBUPROFEN 600 MG PO TABS: 600.0000 mg | ORAL_TABLET | Freq: Four times a day (QID) | ORAL | 0 refills | Status: AC | PRN

## 2022-06-12 NOTE — ED Provider Notes (Signed)
[facility] EMERGENCY DEPARTMENT Provider Note   CSN: [id number] Arrival date & time: 06/12/22  1021     History  Chief Complaint  Patient presents with   Dental Pain    Tamara Aguirre is a 31 y.o. female presenting with dental pain.  Reports that this has been going on for about a month.  A month ago she was prescribed antibiotics but reports leaving them in a hotel room while out of town so she did not finish the course.  She feels as though the infection has come back.  Having 10 out of 10 right-sided dental pain.  Has tried to get in touch with multiple dentist however they do not accept her insurance.  She says the ones that do are booked out for a very long time.  Has been taking Tylenol for her discomfort and reports it does not work.  No fevers or chills   Dental Pain      Home Medications Prior to Admission medications   Medication Sig Start Date End Date Taking? Authorizing Provider  ibuprofen (ADVIL) 600 MG tablet Take 1 tablet (600 mg total) by mouth every 6 (six) hours as needed. 06/12/22  Yes Karlea Mckibbin A, PA-C  penicillin v potassium (VEETID) 500 MG tablet Take 1 tablet (500 mg total) by mouth 4 (four) times daily for 10 days. 06/12/22 06/22/22 Yes Doshia Dalia A, PA-C  ferrous gluconate (FERGON) 324 MG tablet Take 324 mg by mouth 2 (two) times daily with a meal.    [provider]  megestrol (MEGACE) 20 MG tablet Take 20 mg by mouth 2 (two) times daily.    [provider]  olmesartan-hydrochlorothiazide (BENICAR HCT) 40-25 MG tablet Take 1 tablet by mouth daily.    [provider]      Allergies    Sulfa antibiotics    Review of Systems   Review of Systems  Physical Exam Updated Vital Signs BP (!) 153/114 (BP Location: Right Arm)   Pulse 78   Temp 97.9 F (36.6 C) (Oral)   Resp 18   Ht 5\' 6"  (1.676 m)   Wt (!) 140.6 kg   LMP  (LMP Unknown) Comment: depo  SpO2 100%   BMI 50.04 kg/m  Physical Exam Vitals and nursing  note reviewed.  Constitutional:      Appearance: Normal appearance.  HENT:     Head: Normocephalic and atraumatic.     Mouth/Throat:      Comments: Broken off molars on the right side.  All teeth in poor dentition.  Some findings consistent with gingivitis on the right side of her mouth.  Airway clear, tolerating secretions.  No trismus.  No signs of PTA/RPA  Eyes:     General: No scleral icterus.    Conjunctiva/sclera: Conjunctivae normal.  Pulmonary:     Effort: Pulmonary effort is normal. No respiratory distress.  Skin:    Findings: No rash.  Neurological:     Mental Status: She is alert.  Psychiatric:        Mood and Affect: Mood normal.     ED Results / Procedures / Treatments   Labs (all labs ordered are listed, but only abnormal results are displayed) Labs Reviewed - No data to display  EKG None  Radiology No results found.  Procedures Procedures   Medications Ordered in ED Medications  ibuprofen (ADVIL) tablet 800 mg (has no administration in time range)  lidocaine (XYLOCAINE) 2 % viscous mouth solution 15 mL (has no  administration in time range)  penicillin v potassium (VEETID) tablet 500 mg (has no administration in time range)    ED Course/ Medical Decision Making/ A&P                           Medical Decision Making Risk Prescription drug management.   31 year old female presenting with dental pain.  Says has been going on for a long time.  Is unable to get in with a dentist.  Per chart review, she was seen in the department last month.  Was given penicillin and she reports only being able to take a few days  Treatment: Given ibuprofen, lidocaine and first dose of penicillin today.  On reevaluation patient reports feeling much better.  MDM/disposition: She is agreeable to discharge with the rest of the penicillin course and ibuprofen.  She will continue to try and contact a dentist.  No signs of systemic infection and stable for discharge at  this time  Final Clinical Impression(s) / ED Diagnoses Final diagnoses:  Pain, dental    Rx / DC Orders ED Discharge Orders          Ordered    ibuprofen (ADVIL) 600 MG tablet  Every 6 hours PRN        06/12/22 1219    penicillin v potassium (VEETID) 500 MG tablet  4 times daily        06/12/22 1219           Results and diagnoses were explained to the patient. Return precautions discussed in full. Patient had no additional questions and expressed complete understanding.   This chart was dictated using voice recognition software.  Despite best efforts to proofread,  errors can occur which can change the documentation meaning.    Saddie Benders, PA-C 06/12/22 1254    Terald Sleeper, MD 06/12/22 1501

## 2022-06-12 NOTE — ED Triage Notes (Signed)
Pt presents with dental pain, right lower teeth x 2 weeks.

## 2022-06-12 NOTE — Discharge Instructions (Addendum)
Keep trying to contact different dentist.  You may place your self on any wait list as there are often cancellations.  Alternate Tylenol and ibuprofen.  Be sure to take the whole course of antibiotics as well.  Ice packs should also help your discomfort.  Return to the emergency department with any difficulty breathing, inability to open your mouth, drooling or high fevers.
